# Patient Record
Sex: Male | Born: 2000
Health system: Southern US, Community
[De-identification: ages and names within clinical notes are randomized; demographics above are authoritative.]

---
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
## 2019-07-08 ENCOUNTER — Encounter (HOSPITAL_BASED_OUTPATIENT_CLINIC_OR_DEPARTMENT_OTHER): Payer: Self-pay

## 2019-07-08 ENCOUNTER — Emergency Department (HOSPITAL_BASED_OUTPATIENT_CLINIC_OR_DEPARTMENT_OTHER)
Admission: EM | Admit: 2019-07-08 | Discharge: 2019-07-08 | Disposition: A | Discharge status: Home / Self Care | Payer: Self-pay | Attending: Emergency Medicine | Admitting: Emergency Medicine

## 2019-07-08 ENCOUNTER — Other Ambulatory Visit: Payer: Self-pay

## 2019-07-08 ENCOUNTER — Emergency Department (HOSPITAL_BASED_OUTPATIENT_CLINIC_OR_DEPARTMENT_OTHER): Payer: Self-pay

## 2019-07-08 DIAGNOSIS — M5442 Lumbago with sciatica, left side: Secondary | ICD-10-CM | POA: Insufficient documentation

## 2019-07-08 DIAGNOSIS — M5441 Lumbago with sciatica, right side: Secondary | ICD-10-CM | POA: Insufficient documentation

## 2019-07-08 NOTE — ED Triage Notes (Signed)
Pt c/o lower back pain x "few months"- with bilat leg pain-denies injury-NAD-steady gait

## 2019-07-08 NOTE — ED Provider Notes (Signed)
[facility] EMERGENCY DEPARTMENT Provider Note   CSN: [id number] Arrival date & time: 07/08/19  1555     History   Chief Complaint Chief Complaint  Patient presents with  . Back Pain    HPI Aaron Alexander is a 18 y.o. male.     The history is provided by the patient and medical records. No language interpreter was used.  Back Pain  Aaron Alexander is a 18 y.o. male who presents to the Emergency Department complaining of back pain. He presents to the emergency department complaining of progressive and gradual onset low back pain. It began several months ago with no inciting injuries. Pain is located across his entire low back. It is worse with activity and turning. It is described as sharp at times. Occasionally the pain radiates to his knees bilaterally. He denies any fevers, weight changes, nausea, vomiting, abdominal pain, changes in urination, urinary incontinence or retention, numbness or weakness. He was working loading trucks but recently quit. He takes ibuprofen and Tylenol at home that gives him partial improvement in his symptoms. History reviewed. No pertinent past medical history.  There are no active problems to display for this patient.   History reviewed. No pertinent surgical history.      Home Medications    Prior to Admission medications   Medication Sig Start Date End Date Taking? Authorizing Provider  azithromycin (ZITHROMAX) 200 MG/5ML suspension Take 12 ml PO today and then 6 ml daily for the next 4 days 09/01/13   Ashley Murrain, NP  cetirizine (ZYRTEC) 10 MG tablet Take 10 mg by mouth daily.    [provider]  guaiFENesin (ROBITUSSIN) 100 MG/5ML liquid Take 5-10 mLs (100-200 mg total) by mouth every 4 (four) hours as needed for cough. 09/01/13   Ashley Murrain, NP  loratadine (CLARITIN) 10 MG tablet Take 10 mg by mouth daily.    [provider]    Family History No family history on file.  Social History Social History   Tobacco Use  . Smoking status: Never Smoker  . Smokeless tobacco: Never Used  Substance Use Topics  . Alcohol use: Yes    Comment: occ  . Drug use: No     Allergies   Amoxicillin   Review of Systems Review of Systems  Musculoskeletal: Positive for back pain.  All other systems reviewed and are negative.    Physical Exam Updated Vital Signs BP 111/71 (BP Location: Left Arm)   Pulse 62   Temp 98.4 F (36.9 C) (Oral)   Resp 18   Ht 5\' 10"  (1.778 m)   Wt 65.8 kg   SpO2 100%   BMI 20.81 kg/m   Physical Exam Vitals signs and nursing note reviewed.  Constitutional:      Appearance: He is well-developed.  HENT:     Head: Normocephalic and atraumatic.  Cardiovascular:     Rate and Rhythm: Normal rate and regular rhythm.     Heart sounds: No murmur.  Pulmonary:     Effort: Pulmonary effort is normal. No respiratory distress.     Breath sounds: Normal breath sounds.  Abdominal:     Palpations: Abdomen is soft.     Tenderness: There is no abdominal tenderness. There is no guarding or rebound.  Musculoskeletal:        General: No tenderness.     Comments: 2+ DP pulses bilateraly  Skin:    General: Skin is warm and dry.  Neurological:  Mental Status: He is alert and oriented to person, place, and time.     Comments: 5/5 strength in proximal and distal muscle groups in BLE.  Sensation to light touch intact throughout BLE.  Psychiatric:        Behavior: Behavior normal.      ED Treatments / Results  Labs (all labs ordered are listed, but only abnormal results are displayed) Labs Reviewed - No data to display  EKG None  Radiology Dg Lumbar Spine Complete  Result Date: 07/08/2019 CLINICAL DATA:  Low back pain with lower extremity radicular symptoms EXAM: LUMBAR SPINE - COMPLETE 4+ VIEW COMPARISON:  None. FINDINGS: Frontal, lateral, spot lumbosacral lateral, and bilateral oblique views were obtained. There are 5 non-rib-bearing lumbar type vertebral bodies.  The T12 ribs are hypoplastic. No fracture or spondylolisthesis. There is slight disc space narrowing at L4-5 and L5-S1. Other disc spaces appear normal. There is no appreciable facet arthropathy. IMPRESSION: Slight disc space narrowing at L4-5 and L5-S1. No appreciable facet arthropathy. No fracture or spondylolisthesis. Electronically Signed   By: Bretta Bang III M.D.   On: 07/08/2019 17:35    Procedures Procedures (including critical care time)  Medications Ordered in ED Medications - No data to display   Initial Impression / Assessment and Plan / ED Course  I have reviewed the triage vital signs and the nursing notes.  Pertinent labs & imaging results that were available during my care of the patient were reviewed by me and considered in my medical decision making (see chart for details).        Patient here for evaluation of atraumatic low back pain. He is non-toxic appearing on evaluation and neurovascularly intact. Imaging is significant for slight disc space narrowing. Discussed with patient low back pain and home care. Discussed outpatient follow-up and return precautions.  Current presentation is not consistent with epidural abscess, cauda equina.  Final Clinical Impressions(s) / ED Diagnoses   Final diagnoses:  Acute midline low back pain with bilateral sciatica    ED Discharge Orders    None       Tilden Fossa, MD 07/08/19 2350

## 2019-09-22 ENCOUNTER — Emergency Department (HOSPITAL_BASED_OUTPATIENT_CLINIC_OR_DEPARTMENT_OTHER): Payer: Self-pay

## 2019-09-22 ENCOUNTER — Other Ambulatory Visit: Payer: Self-pay

## 2019-09-22 ENCOUNTER — Encounter (HOSPITAL_BASED_OUTPATIENT_CLINIC_OR_DEPARTMENT_OTHER): Payer: Self-pay | Admitting: *Deleted

## 2019-09-22 ENCOUNTER — Emergency Department (HOSPITAL_BASED_OUTPATIENT_CLINIC_OR_DEPARTMENT_OTHER)
Admission: EM | Admit: 2019-09-22 | Discharge: 2019-09-22 | Disposition: A | Discharge status: Home / Self Care | Payer: Self-pay | Attending: Emergency Medicine | Admitting: Emergency Medicine

## 2019-09-22 DIAGNOSIS — Z20828 Contact with and (suspected) exposure to other viral communicable diseases: Secondary | ICD-10-CM | POA: Insufficient documentation

## 2019-09-22 DIAGNOSIS — F17200 Nicotine dependence, unspecified, uncomplicated: Secondary | ICD-10-CM | POA: Insufficient documentation

## 2019-09-22 DIAGNOSIS — J069 Acute upper respiratory infection, unspecified: Secondary | ICD-10-CM | POA: Insufficient documentation

## 2019-09-22 DIAGNOSIS — Z79899 Other long term (current) drug therapy: Secondary | ICD-10-CM | POA: Insufficient documentation

## 2019-09-22 MED ORDER — BENZONATATE 100 MG PO CAPS: 100.0000 mg | ORAL_CAPSULE | Freq: Three times a day (TID) | ORAL | 0 refills | Status: AC | PRN

## 2019-09-22 NOTE — Discharge Instructions (Signed)
Your Covid test is pending and should result in the next 24-48 hours. Please quarantine at home until your test has resulted.    You appear to have an upper respiratory infection (URI). An upper respiratory tract infection, or cold, is a viral infection of the air passages leading to the lungs. It is contagious and can be spread to others, especially during the first 3 or 4 days. It cannot be cured by antibiotics or other medicines. RETURN IMMEDIATELY IF you develop shortness of breath, confusion or altered mental status, a new rash, become dizzy, faint, or poorly responsive, or are unable to be cared for at home.

## 2019-09-22 NOTE — ED Provider Notes (Signed)
Aaron Alexander   CSN: [id number] Arrival date & time: 09/22/19  1301     History Chief Complaint  Patient presents with  . Cough    Aaron Alexander is a 18 y.o. male who presents emergency department with a chief complaint of cough.  Had some symptoms for 7 days.  He does occasionally smoke tobacco products.  Patient states that he has had productive cough without fever.  He denies shortness of breath.  He has pain predominantly on the right side of his chest when he coughs but denies hemoptysis, shortness of breath, unilateral leg swelling, history of blood clots including DVT or PE.  He has noticed a little bit of blood-tinged sputum from the nose.  He has not been taking anything for his symptoms.  He states that he is prone to bronchitis and pneumonias.  He denies loss of sense of taste or smell, exposure to Covid or known contacts with similar symptoms.  HPI     History reviewed. No pertinent past medical history.  There are no problems to display for this patient.   History reviewed. No pertinent surgical history.     No family history on file.  Social History   Tobacco Use  . Smoking status: Current Some Day Smoker  . Smokeless tobacco: Never Used  Substance Use Topics  . Alcohol use: Yes    Comment: occ  . Drug use: Yes    Types: Marijuana    Home Medications Prior to Admission medications   Medication Sig Start Date End Date Taking? Authorizing Provider  azithromycin (ZITHROMAX) 200 MG/5ML suspension Take 12 ml PO today and then 6 ml daily for the next 4 days 09/01/13   Ashley Murrain, NP  cetirizine (ZYRTEC) 10 MG tablet Take 10 mg by mouth daily.    [provider]  guaiFENesin (ROBITUSSIN) 100 MG/5ML liquid Take 5-10 mLs (100-200 mg total) by mouth every 4 (four) hours as needed for cough. 09/01/13   Ashley Murrain, NP  loratadine (CLARITIN) 10 MG tablet Take 10 mg by mouth daily.    [provider]      Allergies    Amoxicillin  Review of Systems   Review of Systems Ten systems reviewed and are negative for acute change, except as noted in the HPI.   Physical Exam Updated Vital Signs BP 130/76 (BP Location: Left Arm)   Pulse 74   Temp 98 F (36.7 C) (Oral)   Resp 16   Ht 5\' 9"  (1.753 m)   Wt 63 kg   SpO2 100%   BMI 20.53 kg/m   Physical Exam Vitals and nursing Alexander reviewed.  Constitutional:      General: He is not in acute distress.    Appearance: He is well-developed. He is not diaphoretic.  HENT:     Head: Normocephalic and atraumatic.  Eyes:     General: No scleral icterus.    Conjunctiva/sclera: Conjunctivae normal.  Cardiovascular:     Rate and Rhythm: Normal rate and regular rhythm.     Heart sounds: Normal heart sounds.  Pulmonary:     Effort: Pulmonary effort is normal. No respiratory distress.     Breath sounds: Normal breath sounds. No rhonchi or rales.  Abdominal:     Palpations: Abdomen is soft.     Tenderness: There is no abdominal tenderness.  Musculoskeletal:     Cervical back: Normal range of motion and neck supple.  Skin:  General: Skin is warm and dry.  Neurological:     Mental Status: He is alert.  Psychiatric:        Behavior: Behavior normal.     ED Results / Procedures / Treatments   Labs (all labs ordered are listed, but only abnormal results are displayed) Labs Reviewed - No data to display  EKG None  Radiology DG Chest 2 View  Result Date: 09/22/2019 CLINICAL DATA:  18 year old male with cough and congestion for 1 week EXAM: CHEST - 2 VIEW COMPARISON:  Prior chest x-ray 09/01/2013 FINDINGS: The lungs are clear and negative for focal airspace consolidation, pulmonary edema or suspicious pulmonary nodule. No pleural effusion or pneumothorax. Cardiac and mediastinal contours are within normal limits. No acute fracture or lytic or blastic osseous lesions. The visualized upper abdominal bowel gas pattern is unremarkable.  IMPRESSION: Normal chest x-ray. Electronically Signed   By: Malachy Moan M.D.   On: 09/22/2019 14:23    Procedures Procedures (including critical care time)  Medications Ordered in ED Medications - No data to display  ED Course  I have reviewed the triage vital signs and the nursing notes.  Pertinent labs & imaging results that were available during my care of the patient were reviewed by me and considered in my medical decision making (see chart for details).    MDM Rules/Calculators/A&P                      Pt CXR negative for acute infiltrate on my interpretation. HDS, afebrile. Will give send out covid test. Patients symptoms are consistent with URI, likely viral etiology. Discussed that antibiotics are not indicated for viral infections. Pt will be discharged with symptomatic treatment.  Verbalizes understanding and is agreeable with plan. Pt is hemodynamically stable & in NAD prior to dc.   Aaron Alexander was evaluated in Emergency Department on 09/22/2019 for the symptoms described in the history of present illness. He was evaluated in the context of the global COVID-19 pandemic, which necessitated consideration that the patient might be at risk for infection with the SARS-CoV-2 virus that causes COVID-19. Institutional protocols and algorithms that pertain to the evaluation of patients at risk for COVID-19 are in a state of rapid change based on information released by regulatory bodies including the CDC and federal and state organizations. These policies and algorithms were followed during the patient's care in the ED.   Final Clinical Impression(s) / ED Diagnoses Final diagnoses:  None    Rx / DC Orders ED Discharge Orders    None       Arthor Captain, PA-C 09/22/19 1437    Melene Plan, DO 09/22/19 1443

## 2019-09-22 NOTE — ED Triage Notes (Signed)
Pt reports cough and runny nose x 1 week, also right side rib pain

## 2019-09-24 LAB — NOVEL CORONAVIRUS, NAA (HOSP ORDER, SEND-OUT TO REF LAB; TAT 18-24 HRS): SARS-CoV-2, NAA: NOT DETECTED

## 2019-09-26 ENCOUNTER — Telehealth: Payer: Self-pay

## 2019-09-26 NOTE — Telephone Encounter (Signed)
Negative COVID results given. Patient results "NOT Detected." Caller expressed understanding. ° °

## 2019-11-06 ENCOUNTER — Encounter (HOSPITAL_BASED_OUTPATIENT_CLINIC_OR_DEPARTMENT_OTHER): Payer: Self-pay | Admitting: *Deleted

## 2019-11-06 ENCOUNTER — Emergency Department (HOSPITAL_BASED_OUTPATIENT_CLINIC_OR_DEPARTMENT_OTHER)
Admission: EM | Admit: 2019-11-06 | Discharge: 2019-11-06 | Disposition: A | Discharge status: Home / Self Care | Payer: Self-pay | Attending: Emergency Medicine | Admitting: Emergency Medicine

## 2019-11-06 ENCOUNTER — Other Ambulatory Visit: Payer: Self-pay

## 2019-11-06 DIAGNOSIS — Z20822 Contact with and (suspected) exposure to covid-19: Secondary | ICD-10-CM | POA: Insufficient documentation

## 2019-11-06 DIAGNOSIS — J029 Acute pharyngitis, unspecified: Secondary | ICD-10-CM

## 2019-11-06 DIAGNOSIS — B349 Viral infection, unspecified: Secondary | ICD-10-CM | POA: Insufficient documentation

## 2019-11-06 DIAGNOSIS — F17200 Nicotine dependence, unspecified, uncomplicated: Secondary | ICD-10-CM | POA: Insufficient documentation

## 2019-11-06 DIAGNOSIS — Z79899 Other long term (current) drug therapy: Secondary | ICD-10-CM | POA: Insufficient documentation

## 2019-11-06 DIAGNOSIS — R070 Pain in throat: Secondary | ICD-10-CM | POA: Insufficient documentation

## 2019-11-06 LAB — GROUP A STREP BY PCR: Group A Strep by PCR: NOT DETECTED

## 2019-11-06 NOTE — Discharge Instructions (Signed)
Your strep test was negative.   We have swabbed you for COVID 19 - please stay home and self isolate until you hear about your results. We will call you if positive. You will need to self isolate for 14 days if positive and are cleared: 11/21/2019. If negative you may return to work immediately.   Take OTC medications as needed for your symptoms.   Follow up with your PCP. If you do not have one you can follow up with [facility] for primary care needs.   Return to the ED for any worsening symptoms including worsening sore throat, inability to swallow liquids, drooling on yourself, difficulty breathing, abdominal pain, nausea, vomiting, or any other concerning symptoms.   If you sore throat persists for > 10 days you may need to be reevaluated for other causes.

## 2019-11-06 NOTE — ED Triage Notes (Signed)
Pt c/o sore throat , bodyaches , h/a x 3 days

## 2019-11-06 NOTE — ED Provider Notes (Signed)
[facility] EMERGENCY DEPARTMENT Provider Note   CSN: [id number] Arrival date & time: 11/06/19  1635     History Chief Complaint  Patient presents with  . Sore Throat    Aaron Alexander is a 19 y.o. male who presents to the ED today complaining of gradual onset, constant, achy, sore throat x 5-6 days.  Also complains about a left-sided headache as well as body aches in his lower back.  He is denying any recent COVID-19 positive exposure however does report that individuals at work will have tested positive and will not say anything and still go to work.  Reports that he has been taking DayQuil and NyQuil with mild relief.  He is still able to tolerate his own secretions and drink liquids without difficulty.  Denies fever, chills, cough, shortness of breath, abdominal pain, nausea, vomiting, neck stiffness, vision changes, confusion, rash, any other associated symptoms.   The history is provided by the patient.       History reviewed. No pertinent past medical history.  There are no problems to display for this patient.   History reviewed. No pertinent surgical history.     History reviewed. No pertinent family history.  Social History   Tobacco Use  . Smoking status: Current Some Day Smoker  . Smokeless tobacco: Never Used  Substance Use Topics  . Alcohol use: Yes    Comment: occ  . Drug use: Yes    Types: Marijuana    Home Medications Prior to Admission medications   Medication Sig Start Date End Date Taking? Authorizing Provider  azithromycin (ZITHROMAX) 200 MG/5ML suspension Take 12 ml PO today and then 6 ml daily for the next 4 days 09/01/13   Janne Napoleon, NP  benzonatate (TESSALON PERLES) 100 MG capsule Take 1 capsule (100 mg total) by mouth 3 (three) times daily as needed for cough (cough). 09/22/19   Arthor Captain, PA-C  cetirizine (ZYRTEC) 10 MG tablet Take 10 mg by mouth daily.    [provider]  guaiFENesin (ROBITUSSIN) 100 MG/5ML  liquid Take 5-10 mLs (100-200 mg total) by mouth every 4 (four) hours as needed for cough. 09/01/13   Janne Napoleon, NP  loratadine (CLARITIN) 10 MG tablet Take 10 mg by mouth daily.    [provider]    Allergies    Amoxicillin  Review of Systems   Review of Systems  Constitutional: Negative for chills and fever.  HENT: Positive for sore throat. Negative for drooling and voice change.   Respiratory: Negative for cough and shortness of breath.   Cardiovascular: Negative for chest pain.  Gastrointestinal: Negative for abdominal pain, nausea and vomiting.  Neurological: Positive for headaches.  All other systems reviewed and are negative.   Physical Exam Updated Vital Signs BP 113/78   Pulse 61   Temp 98.7 F (37.1 C) (Oral)   Resp 16   Ht 5\' 9"  (1.753 m)   Wt 63.5 kg   SpO2 100%   BMI 20.67 kg/m   Physical Exam Vitals and nursing note reviewed.  Constitutional:      Appearance: He is not ill-appearing or diaphoretic.     Comments: No acute distress  HENT:     Head: Normocephalic and atraumatic.     Mouth/Throat:     Mouth: Mucous membranes are moist.     Pharynx: Uvula midline. Posterior oropharyngeal erythema present.     Tonsils: No tonsillar exudate.  Eyes:     Conjunctiva/sclera: Conjunctivae normal.  Cardiovascular:     Rate and Rhythm: Normal rate and regular rhythm.     Heart sounds: Normal heart sounds.  Pulmonary:     Effort: Pulmonary effort is normal.     Breath sounds: Normal breath sounds. No wheezing, rhonchi or rales.  Abdominal:     Tenderness: There is no abdominal tenderness. There is no guarding or rebound.  Skin:    General: Skin is warm and dry.     Coloration: Skin is not jaundiced.  Neurological:     General: No focal deficit present.     Mental Status: He is alert and oriented to person, place, and time.     ED Results / Procedures / Treatments   Labs (all labs ordered are listed, but only abnormal results are  displayed) Labs Reviewed  GROUP A STREP BY PCR  NOVEL CORONAVIRUS, NAA (HOSP ORDER, SEND-OUT TO REF LAB; TAT 18-24 HRS)    EKG None  Radiology No results found.  Procedures Procedures (including critical care time)  Medications Ordered in ED Medications - No data to display  ED Course  I have reviewed the triage vital signs and the nursing notes.  Pertinent labs & imaging results that were available during my care of the patient were reviewed by me and considered in my medical decision making (see chart for details).  19 year old male with sore throat, HA, body aches x 5-6 days.  Recent COVID-19 positive exposure.  On arrival to the ED patient afebrile, nontachycardic and nontachypneic.  On my exam he is sitting up comfortably in bed.  He has some posterior oropharyngeal erythema however no exudate.  Uvula is midline.  No signs of PTA or other deep space infections.  Tolerating secretions.  Lungs clear to auscultation bilaterally.  No focal neuro deficits.  A rapid strep test was obtained prior to being seen which was negative.  Will swab for Covid at this time.  Have advised patient he will need to self isolate until he receives his results.  If positive he cannot return to work for 14 days.  Work note has been provided.  Patient advised to follow-up with his PCP.  Strict return precautions have been discussed.  Patient is in agreement with plan and stable for discharge home.   This note was prepared using Dragon voice recognition software and may include unintentional dictation errors due to the inherent limitations of voice recognition software.  Aaron Alexander was evaluated in Emergency Department on 11/06/2019 for the symptoms described in the history of present illness. He was evaluated in the context of the global COVID-19 pandemic, which necessitated consideration that the patient might be at risk for infection with the SARS-CoV-2 virus that causes COVID-19. Institutional protocols  and algorithms that pertain to the evaluation of patients at risk for COVID-19 are in a state of rapid change based on information released by regulatory bodies including the CDC and federal and state organizations. These policies and algorithms were followed during the patient's care in the ED.    MDM Rules/Calculators/A&P                       Final Clinical Impression(s) / ED Diagnoses Final diagnoses:  Sore throat  Viral illness    Rx / DC Orders ED Discharge Orders    None       Discharge Instructions     Your strep test was negative.   We have swabbed you for COVID 19 -  please stay home and self isolate until you hear about your results. We will call you if positive. You will need to self isolate for 14 days if positive and are cleared: 11/21/2019. If negative you may return to work immediately.   Take OTC medications as needed for your symptoms.   Follow up with your PCP. If you do not have one you can follow up with [facility] for primary care needs.   Return to the ED for any worsening symptoms including worsening sore throat, inability to swallow liquids, drooling on yourself, difficulty breathing, abdominal pain, nausea, vomiting, or any other concerning symptoms.   If you sore throat persists for > 10 days you may need to be reevaluated for other causes.        Tanda Rockers, PA-C 11/06/19 1757    Tegeler, Canary Brim, MD 11/07/19 Jacinta Shoe

## 2019-11-07 LAB — NOVEL CORONAVIRUS, NAA (HOSP ORDER, SEND-OUT TO REF LAB; TAT 18-24 HRS): SARS-CoV-2, NAA: NOT DETECTED

## 2020-02-20 ENCOUNTER — Encounter (HOSPITAL_BASED_OUTPATIENT_CLINIC_OR_DEPARTMENT_OTHER): Payer: Self-pay

## 2020-02-20 ENCOUNTER — Emergency Department (HOSPITAL_BASED_OUTPATIENT_CLINIC_OR_DEPARTMENT_OTHER)
Admission: EM | Admit: 2020-02-20 | Discharge: 2020-02-20 | Disposition: A | Discharge status: Home / Self Care | Payer: Self-pay | Attending: Emergency Medicine | Admitting: Emergency Medicine

## 2020-02-20 ENCOUNTER — Other Ambulatory Visit: Payer: Self-pay

## 2020-02-20 DIAGNOSIS — F17290 Nicotine dependence, other tobacco product, uncomplicated: Secondary | ICD-10-CM | POA: Insufficient documentation

## 2020-02-20 DIAGNOSIS — R1013 Epigastric pain: Secondary | ICD-10-CM | POA: Insufficient documentation

## 2020-02-20 MED ORDER — PANTOPRAZOLE SODIUM 20 MG PO TBEC: 20.0000 mg | DELAYED_RELEASE_TABLET | Freq: Every day | ORAL | 0 refills | Status: DC

## 2020-02-20 MED ORDER — PANTOPRAZOLE SODIUM 20 MG PO TBEC: 20.0000 mg | DELAYED_RELEASE_TABLET | Freq: Every day | ORAL | 0 refills | Status: AC

## 2020-02-20 MED FILL — PANTOPRAZOLE SOD DR 20 MG T: 20 | 30 days supply | Qty: 30 | Fill #0

## 2020-02-20 NOTE — ED Provider Notes (Signed)
[facility] EMERGENCY DEPARTMENT Provider Note   CSN: [id number] Arrival date & time: 02/20/20  2025     History Chief Complaint  Patient presents with  . Abdominal Pain    Aaron Alexander is a 19 y.o. male.  The history is provided by the patient.  Abdominal Pain Pain location:  Epigastric Pain quality: aching   Pain radiates to:  Does not radiate Onset quality:  Gradual Timing:  Intermittent Context: eating   Relieved by:  Nothing Worsened by:  Nothing Associated symptoms: belching and nausea   Associated symptoms: no chest pain, no chills, no cough, no diarrhea, no dysuria, no fever, no hematuria, no shortness of breath, no sore throat and no vomiting   Risk factors: has not had multiple surgeries        History reviewed. No pertinent past medical history.  There are no problems to display for this patient.   History reviewed. No pertinent surgical history.     No family history on file.  Social History   Tobacco Use  . Smoking status: Current Some Day Smoker    Types: Cigars  . Smokeless tobacco: Never Used  Substance Use Topics  . Alcohol use: Yes    Comment: occ  . Drug use: Yes    Types: Marijuana    Comment: daily    Home Medications Prior to Admission medications   Medication Sig Start Date End Date Taking? Authorizing Provider  azithromycin (ZITHROMAX) 200 MG/5ML suspension Take 12 ml PO today and then 6 ml daily for the next 4 days 09/01/13   Janne Napoleon, NP  benzonatate (TESSALON PERLES) 100 MG capsule Take 1 capsule (100 mg total) by mouth 3 (three) times daily as needed for cough (cough). 09/22/19   Arthor Captain, PA-C  cetirizine (ZYRTEC) 10 MG tablet Take 10 mg by mouth daily.    [provider]  guaiFENesin (ROBITUSSIN) 100 MG/5ML liquid Take 5-10 mLs (100-200 mg total) by mouth every 4 (four) hours as needed for cough. 09/01/13   Janne Napoleon, NP  loratadine (CLARITIN) 10 MG tablet Take 10 mg by mouth daily.     [provider]  pantoprazole (PROTONIX) 20 MG tablet Take 1 tablet (20 mg total) by mouth daily. 02/20/20 03/21/20  Virgina Norfolk, DO    Allergies    Amoxicillin  Review of Systems   Review of Systems  Constitutional: Negative for chills and fever.  HENT: Negative for ear pain and sore throat.   Eyes: Negative for pain and visual disturbance.  Respiratory: Negative for cough and shortness of breath.   Cardiovascular: Negative for chest pain and palpitations.  Gastrointestinal: Positive for abdominal pain and nausea. Negative for diarrhea and vomiting.  Genitourinary: Negative for dysuria and hematuria.  Musculoskeletal: Negative for arthralgias and back pain.  Skin: Negative for color change and rash.  Neurological: Negative for seizures and syncope.  All other systems reviewed and are negative.   Physical Exam Updated Vital Signs BP 121/67 (BP Location: Right Arm)   Pulse 60   Temp 99 F (37.2 C) (Oral)   Resp 18   Ht 5\' 9"  (1.753 m)   Wt 65.8 kg   SpO2 100%   BMI 21.41 kg/m   Physical Exam Vitals and nursing note reviewed.  Constitutional:      General: He is not in acute distress.    Appearance: He is well-developed. He is not ill-appearing.  HENT:     Head: Normocephalic and atraumatic.  Mouth/Throat:     Mouth: Mucous membranes are moist.  Eyes:     Extraocular Movements: Extraocular movements intact.     Conjunctiva/sclera: Conjunctivae normal.     Pupils: Pupils are equal, round, and reactive to light.  Cardiovascular:     Rate and Rhythm: Normal rate and regular rhythm.     Heart sounds: Normal heart sounds. No murmur.  Pulmonary:     Effort: Pulmonary effort is normal. No respiratory distress.     Breath sounds: Normal breath sounds.  Abdominal:     General: There is no distension.     Palpations: Abdomen is soft.     Tenderness: There is no abdominal tenderness. There is no right CVA tenderness, left CVA tenderness, guarding or rebound.  Negative signs include Murphy's sign and Rovsing's sign.  Musculoskeletal:     Cervical back: Neck supple.  Skin:    General: Skin is warm and dry.     Capillary Refill: Capillary refill takes less than 2 seconds.  Neurological:     Mental Status: He is alert.     ED Results / Procedures / Treatments   Labs (all labs ordered are listed, but only abnormal results are displayed) Labs Reviewed - No data to display  EKG None  Radiology No results found.  Procedures Procedures (including critical care time)  Medications Ordered in ED Medications - No data to display  ED Course  I have reviewed the triage vital signs and the nursing notes.  Pertinent labs & imaging results that were available during my care of the patient were reviewed by me and considered in my medical decision making (see chart for details).    MDM Rules/Calculators/A&P                      [name] is a 19 year old male with no significant medical history presents to the ED with abdominal pain.  Patient with unremarkable vitals.  No fever.  Pain mostly in the epigastric region for several months on and off.  Appears to get worse with eating, acid reflux burning.  Especially at night when he is sleeping he feels a sour taste, up his throat.  Seems to have more symptoms when eating certain foods.  Does not have any focal tenderness on abdominal exam.  No concern for pancreatitis, cholecystitis, appendicitis or bowel obstruction.  Overall symptoms appear consistent with reflux.  Educated about foods to avoid.  Recommend avoiding alcohol as well.  Will start Protonix and have him follow-up outpatient.  Given return precautions and discharged from ED in good condition.  This chart was dictated using voice recognition software.  Despite best efforts to proofread,  errors can occur which can change the documentation meaning.    Final Clinical Impression(s) / ED Diagnoses Final diagnoses:  Epigastric pain     Rx / DC Orders ED Discharge Orders         Ordered    pantoprazole (PROTONIX) 20 MG tablet  Daily,   Status:  Discontinued     02/20/20 0809    pantoprazole (PROTONIX) 20 MG tablet  Daily     02/20/20 0809           Lennice Sites, DO 02/20/20 [phone]

## 2020-02-20 NOTE — ED Triage Notes (Signed)
Pt arrives with c/o abdominal pain "for months" with vomiting. Pt reports he tends to vomit more when eating out or when eating large meals. NAD.

## 2020-04-12 ENCOUNTER — Encounter (HOSPITAL_BASED_OUTPATIENT_CLINIC_OR_DEPARTMENT_OTHER): Payer: Self-pay | Admitting: Emergency Medicine

## 2020-04-12 ENCOUNTER — Other Ambulatory Visit: Payer: Self-pay

## 2020-04-12 DIAGNOSIS — M79674 Pain in right toe(s): Secondary | ICD-10-CM | POA: Insufficient documentation

## 2020-04-12 DIAGNOSIS — Z5321 Procedure and treatment not carried out due to patient leaving prior to being seen by health care provider: Secondary | ICD-10-CM | POA: Insufficient documentation

## 2020-04-12 NOTE — ED Triage Notes (Signed)
Right great toe pain since yesterday. Small area of swelling about the size of a pea below nail.

## 2020-04-13 ENCOUNTER — Emergency Department (HOSPITAL_BASED_OUTPATIENT_CLINIC_OR_DEPARTMENT_OTHER)
Admission: EM | Admit: 2020-04-13 | Discharge: 2020-04-13 | Disposition: A | Discharge status: Home / Self Care | Payer: Self-pay | Attending: Emergency Medicine | Admitting: Emergency Medicine

## 2020-04-13 NOTE — ED Notes (Signed)
No answer in lobby x 2.

## 2020-04-13 NOTE — ED Notes (Addendum)
Called x1 for treatment room at 0122, no answer

## 2020-05-26 ENCOUNTER — Encounter (HOSPITAL_BASED_OUTPATIENT_CLINIC_OR_DEPARTMENT_OTHER): Payer: Self-pay

## 2020-05-26 ENCOUNTER — Other Ambulatory Visit: Payer: Self-pay

## 2020-05-26 ENCOUNTER — Emergency Department (HOSPITAL_BASED_OUTPATIENT_CLINIC_OR_DEPARTMENT_OTHER)
Admission: EM | Admit: 2020-05-26 | Discharge: 2020-05-26 | Disposition: A | Discharge status: Home / Self Care | Payer: Self-pay | Attending: Emergency Medicine | Admitting: Emergency Medicine

## 2020-05-26 DIAGNOSIS — J069 Acute upper respiratory infection, unspecified: Secondary | ICD-10-CM | POA: Insufficient documentation

## 2020-05-26 DIAGNOSIS — Z5321 Procedure and treatment not carried out due to patient leaving prior to being seen by health care provider: Secondary | ICD-10-CM | POA: Insufficient documentation

## 2020-05-26 NOTE — ED Triage Notes (Signed)
Pt c/o URI sx with loss of taste and smell x 2 days-NAD-steady gait

## 2021-07-13 ENCOUNTER — Encounter (HOSPITAL_BASED_OUTPATIENT_CLINIC_OR_DEPARTMENT_OTHER): Payer: Self-pay

## 2021-07-13 ENCOUNTER — Emergency Department (HOSPITAL_BASED_OUTPATIENT_CLINIC_OR_DEPARTMENT_OTHER)
Admission: EM | Admit: 2021-07-13 | Discharge: 2021-07-13 | Disposition: A | Discharge status: Home / Self Care | Payer: Self-pay | Attending: Emergency Medicine | Admitting: Emergency Medicine

## 2021-07-13 ENCOUNTER — Other Ambulatory Visit: Payer: Self-pay

## 2021-07-13 ENCOUNTER — Emergency Department (HOSPITAL_BASED_OUTPATIENT_CLINIC_OR_DEPARTMENT_OTHER): Payer: Self-pay

## 2021-07-13 DIAGNOSIS — M62830 Muscle spasm of back: Secondary | ICD-10-CM

## 2021-07-13 DIAGNOSIS — M62838 Other muscle spasm: Secondary | ICD-10-CM | POA: Insufficient documentation

## 2021-07-13 DIAGNOSIS — Z87891 Personal history of nicotine dependence: Secondary | ICD-10-CM | POA: Insufficient documentation

## 2021-07-13 DIAGNOSIS — M546 Pain in thoracic spine: Secondary | ICD-10-CM | POA: Insufficient documentation

## 2021-07-13 MED ORDER — MELOXICAM 7.5 MG PO TABS: 7.5000 mg | ORAL_TABLET | Freq: Every day | ORAL | 0 refills | Status: AC

## 2021-07-13 MED ORDER — LIDOCAINE 5 % EX PTCH: 1.0000 | MEDICATED_PATCH | CUTANEOUS | 0 refills | Status: AC

## 2021-07-13 MED ORDER — METHOCARBAMOL 500 MG PO TABS: 500.0000 mg | ORAL_TABLET | Freq: Two times a day (BID) | ORAL | 0 refills | Status: DC

## 2021-07-13 NOTE — Discharge Instructions (Signed)
Apply Lidoderm patch as needed as directed.  Take meloxicam and Robaxin as needed as prescribed.  Recommend warm compresses for 20 minutes at a time followed by gentle stretching.  Follow-up with sports medicine if not improving after 7 to 10 days.

## 2021-07-13 NOTE — ED Triage Notes (Signed)
Patient complains of "lung pain" Patient states his upper back is hurting. Denies cervical pain/tenderness. States it is not spinal pain.  States he feels like he cant take a full breath. Respirations even/unlabored. Speaking in full sentences

## 2021-07-13 NOTE — ED Provider Notes (Signed)
[facility] EMERGENCY DEPARTMENT Provider Note   CSN: [id number] Arrival date & time: 07/13/21  1157     History Chief Complaint  Patient presents with   Back Pain    Aaron Alexander is a 20 y.o. male.  20 year old male with complaint of upper back pain onset last night, progressively worsening. Reports mild SHOB with this. 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 Marijuana use, does not smoke. Has not taken anything for his pain. Did try stretching which made his pain worse.      History reviewed. No pertinent past medical history.  There are no problems to display for this patient.   History reviewed. No pertinent surgical history.     History reviewed. No pertinent family history.  Social History   Tobacco Use   Smoking status: Former    Types: Cigars    Quit date: 06/13/2021    Years since quitting: 0.0   Smokeless tobacco: Never  Vaping Use   Vaping Use: Former  Substance Use Topics   Alcohol use: Yes    Comment: occ   Drug use: Not Currently    Types: Marijuana    Comment: occ    Home Medications Prior to Admission medications   Medication Sig Start Date End Date Taking? Authorizing Provider  lidocaine (LIDODERM) 5 % Place 1 patch onto the skin daily. Remove & Discard patch within 12 hours or as directed by MD 07/13/21  Yes Jeannie Fend, PA-C  meloxicam (MOBIC) 7.5 MG tablet Take 1 tablet (7.5 mg total) by mouth daily for 10 days. 07/13/21 07/23/21 Yes Jeannie Fend, PA-C  methocarbamol (ROBAXIN) 500 MG tablet Take 1 tablet (500 mg total) by mouth 2 (two) times daily. 07/13/21  Yes Jeannie Fend, PA-C  azithromycin [facility]) 200 MG/5ML suspension Take 12 ml PO today and then 6 ml daily for the next 4 days 09/01/13   Janne Napoleon, NP  benzonatate (TESSALON PERLES) 100 MG capsule Take 1 capsule (100 mg total) by mouth 3 (three) times daily as needed for cough (cough). 09/22/19   Arthor Captain, PA-C  cetirizine (ZYRTEC) 10 MG tablet Take 10 mg by mouth daily.    [provider]  guaiFENesin (ROBITUSSIN) 100 MG/5ML liquid Take 5-10 mLs (100-200 mg total) by mouth every 4 (four) hours as needed for cough. 09/01/13   Janne Napoleon, NP  loratadine (CLARITIN) 10 MG tablet Take 10 mg by mouth daily.    [provider]  pantoprazole (PROTONIX) 20 MG tablet Take 1 tablet (20 mg total) by mouth daily. 02/20/20 03/21/20  Virgina Norfolk, DO    Allergies    Amoxicillin  Review of Systems   Review of Systems  Constitutional:  Negative for fever.  Respiratory:  Positive for shortness of breath.   Cardiovascular:  Negative for chest pain.  Gastrointestinal:  Negative for abdominal pain.  Musculoskeletal:  Positive for back pain. Negative for gait problem, neck pain and neck stiffness.  Skin:  Negative for rash and wound.  Allergic/Immunologic: Negative for immunocompromised state.  Neurological:  Negative for weakness and numbness.  Hematological:  Negative for adenopathy.  All other systems reviewed and are negative.  Physical Exam Updated Vital Signs BP 116/62 (BP Location: Left Arm)   Pulse 72   Temp 98 F (36.7 C) (Oral)   Resp 16   Ht 5\' 9"  (1.753 m)   Wt 65.8 kg   SpO2 100%  BMI 21.41 kg/m   Physical Exam Vitals and nursing note reviewed.  Constitutional:      General: He is not in acute distress.    Appearance: He is well-developed. He is not diaphoretic.  HENT:     Head: Normocephalic and atraumatic.  Cardiovascular:     Rate and Rhythm: Normal rate and regular rhythm.     Heart sounds: Normal heart sounds.  Pulmonary:     Effort: Pulmonary effort is normal.     Breath sounds: Normal breath sounds.  Musculoskeletal:        General: Tenderness present. No swelling.     Cervical back: Normal. No tenderness or bony tenderness.     Thoracic back: Tenderness present. No bony tenderness.     Lumbar back: No tenderness or bony tenderness.        Back:     Comments: Worse with flexion/extension  Skin:    General: Skin is warm and dry.     Findings: No erythema or rash.  Neurological:     Mental Status: He is alert and oriented to person, place, and time.     Motor: No weakness.     Gait: Gait normal.  Psychiatric:        Behavior: Behavior normal.    ED Results / Procedures / Treatments   Labs (all labs ordered are listed, but only abnormal results are displayed) Labs Reviewed - No data to display  EKG None  Radiology DG Chest Portable 1 View  Result Date: 07/13/2021 CLINICAL DATA:  Chest pain EXAM: PORTABLE CHEST 1 VIEW COMPARISON:  09/22/2019 FINDINGS: The heart size and mediastinal contours are within normal limits. Both lungs are clear. The visualized skeletal structures are unremarkable. IMPRESSION: Normal chest x-ray. Electronically Signed   By: Duanne Guess D.O.   On: 07/13/2021 12:42    Procedures Procedures   Medications Ordered in ED Medications - No data to display  ED Course  I have reviewed the triage vital signs and the nursing notes.  Pertinent labs & imaging results that were available during my care of the patient were reviewed by me and considered in my medical decision making (see chart for details).  Clinical Course as of 07/13/21 1543  Tue Jul 13, 2021  [id number] 20 year old male with complaint of pain in his upper back as above.  He is tender to palpation along the left and right trapezius extending along medial border of bilateral scapula.  No midline or bony tenderness.  No history of trauma.  Chest x-ray is unremarkable.  Suspect muscle spasm, along long thoracic nerve distribution.  Recommend Lidoderm patches, warm compresses and gentle stretching.  We will also give meloxicam and Robaxin. Recommend follow-up with sports medicine if not improving after 7 to 10 days. [LM]    Clinical Course User Index [LM] Alden Hipp   MDM Rules/Calculators/A&P                            Final Clinical Impression(s) / ED Diagnoses Final diagnoses:  Muscle spasm of back    Rx / DC Orders ED Discharge Orders          Ordered    methocarbamol (ROBAXIN) 500 MG tablet  2 times daily        07/13/21 1539    meloxicam (MOBIC) 7.5 MG tablet  Daily        07/13/21 1539    lidocaine (LIDODERM) 5 %  Every 24 hours        07/13/21 1539             Alden Hipp 07/13/21 1543    Tegeler, Canary Brim, MD 07/13/21 [phone]

## 2021-10-11 ENCOUNTER — Emergency Department (HOSPITAL_BASED_OUTPATIENT_CLINIC_OR_DEPARTMENT_OTHER)
Admission: EM | Admit: 2021-10-11 | Discharge: 2021-10-11 | Disposition: A | Discharge status: Home / Self Care | Payer: Commercial Managed Care - HMO | Attending: Emergency Medicine | Admitting: Emergency Medicine

## 2021-10-11 ENCOUNTER — Other Ambulatory Visit: Payer: Self-pay

## 2021-10-11 ENCOUNTER — Emergency Department (HOSPITAL_BASED_OUTPATIENT_CLINIC_OR_DEPARTMENT_OTHER): Payer: Commercial Managed Care - HMO

## 2021-10-11 ENCOUNTER — Encounter (HOSPITAL_BASED_OUTPATIENT_CLINIC_OR_DEPARTMENT_OTHER): Payer: Self-pay

## 2021-10-11 DIAGNOSIS — Y9366 Activity, soccer: Secondary | ICD-10-CM | POA: Diagnosis not present

## 2021-10-11 DIAGNOSIS — S99911A Unspecified injury of right ankle, initial encounter: Secondary | ICD-10-CM | POA: Diagnosis present

## 2021-10-11 DIAGNOSIS — S93401A Sprain of unspecified ligament of right ankle, initial encounter: Secondary | ICD-10-CM

## 2021-10-11 DIAGNOSIS — S93439A Sprain of tibiofibular ligament of unspecified ankle, initial encounter: Secondary | ICD-10-CM | POA: Diagnosis not present

## 2021-10-11 DIAGNOSIS — W2102XA Struck by soccer ball, initial encounter: Secondary | ICD-10-CM | POA: Diagnosis not present

## 2021-10-11 MED ORDER — METHOCARBAMOL 500 MG PO TABS: 500.0000 mg | ORAL_TABLET | Freq: Two times a day (BID) | ORAL | 0 refills | Status: AC

## 2021-10-11 NOTE — ED Provider Notes (Signed)
[facility] EMERGENCY DEPARTMENT Provider Note   CSN: [id number] Arrival date & time: 10/11/21  1340     History  Chief Complaint  Patient presents with   Leg Injury    Aaron Alexander is a 21 y.o. male who presents after an injury while playing soccer yesterday.  Patient reports that he slid, fell onto his right lower extremity, collapsed onto his right ankle with his buttock.  Patient reports that he is having difficulty walking after the injury.  Patient denies any numbness, tingling.  Patient has not taken anything for pain at this time.  Patient is applying ice to the affected area.  HPI     Home Medications Prior to Admission medications   Medication Sig Start Date End Date Taking? Authorizing Provider  methocarbamol (ROBAXIN) 500 MG tablet Take 1 tablet (500 mg total) by mouth 2 (two) times daily. 10/11/21  Yes Ryelan Kazee H, PA-C  azithromycin (ZITHROMAX) 200 MG/5ML suspension Take 12 ml PO today and then 6 ml daily for the next 4 days 09/01/13   Ashley Murrain, NP  benzonatate (TESSALON PERLES) 100 MG capsule Take 1 capsule (100 mg total) by mouth 3 (three) times daily as needed for cough (cough). 09/22/19   Margarita Mail, PA-C  cetirizine (ZYRTEC) 10 MG tablet Take 10 mg by mouth daily.    [provider]  guaiFENesin (ROBITUSSIN) 100 MG/5ML liquid Take 5-10 mLs (100-200 mg total) by mouth every 4 (four) hours as needed for cough. 09/01/13   Ashley Murrain, NP  lidocaine (LIDODERM) 5 % Place 1 patch onto the skin daily. Remove & Discard patch within 12 hours or as directed by MD 07/13/21   Tacy Learn, PA-C  loratadine (CLARITIN) 10 MG tablet Take 10 mg by mouth daily.    [provider]  pantoprazole (PROTONIX) 20 MG tablet Take 1 tablet (20 mg total) by mouth daily. 02/20/20 03/21/20  Lennice Sites, DO      Allergies    Amoxicillin    Review of Systems   Review of Systems  Musculoskeletal:  Positive for joint swelling.  All other  systems reviewed and are negative.  Physical Exam Updated Vital Signs BP 130/73 (BP Location: Right Arm)    Pulse 88    Temp 98.2 F (36.8 C) (Oral)    Resp 18    Ht 5\' 9"  (1.753 m)    Wt 71.7 kg    SpO2 100%    BMI 23.33 kg/m  Physical Exam Vitals and nursing note reviewed.  Constitutional:      General: He is not in acute distress.    Appearance: Normal appearance.  HENT:     Head: Normocephalic and atraumatic.  Eyes:     General:        Right eye: No discharge.        Left eye: No discharge.  Cardiovascular:     Rate and Rhythm: Normal rate and regular rhythm.     Pulses: Normal pulses.     Comments: DP, PT pulses intact bilaterally Pulmonary:     Effort: Pulmonary effort is normal. No respiratory distress.  Musculoskeletal:        General: No deformity.     Comments: Significant tenderness to palpation of the lateral malleolus, lateral aspect of right foot, some tenderness to compression of the tibia, fibula.  Skin:    General: Skin is warm and dry.     Capillary Refill: Capillary refill takes less than 2 seconds.  Neurological:     Mental Status: He is alert and oriented to person, place, and time.  Psychiatric:        Mood and Affect: Mood normal.        Behavior: Behavior normal.    ED Results / Procedures / Treatments   Labs (all labs ordered are listed, but only abnormal results are displayed) Labs Reviewed - No data to display  EKG None  Radiology DG Tibia/Fibula Right  Result Date: 10/11/2021 CLINICAL DATA:  Soccer injury yesterday, now with right lower extremity pain. EXAM: RIGHT TIBIA AND FIBULA - 2 VIEW COMPARISON:  Right ankle radiographs-earlier same day FINDINGS: Suspected minimal amount of soft tissue swelling about the lateral malleolus. No associated fracture or dislocation. No radiopaque foreign body. IMPRESSION: Soft tissue swelling about the lateral malleolus without associated fracture or radiopaque foreign body. Electronically Signed   By: Sandi Mariscal M.D.   On: 10/11/2021 14:24   DG Ankle Complete Right  Result Date: 10/11/2021 CLINICAL DATA:  Injured right lower extremity pain soccer yesterday. EXAM: RIGHT ANKLE - COMPLETE 3+ VIEW COMPARISON:  Right tibia and fibular radiographs-earlier same day FINDINGS: There is a minimal amount of soft tissue swelling about the lateral malleolus. No associated fracture or dislocation. Joint spaces appear preserved. The ankle mortise appears preserved given obliquity. No definite ankle joint effusion. No plantar calcaneal spur. Note is made of a small os trigonum. No radiopaque foreign body. IMPRESSION: Minimal amount of soft tissue swelling about the lateral malleolus without associated fracture or dislocation. Electronically Signed   By: Sandi Mariscal M.D.   On: 10/11/2021 14:17    Procedures Procedures    Medications Ordered in ED Medications - No data to display  ED Course/ Medical Decision Making/ A&P                           Medical Decision Making  Is an overall well-appearing patient who presents with concern for an acute injury to the right lower extremity after playing soccer yesterday.  The differential diagnosis includes ankle sprain, ankle fracture, tib-fib fracture versus other.  This is not an exhaustive differential.  Physical exam is remarkable for some tenderness to palpation of the tibia/fibula to compression.  Otherwise tenderness palpation of the lateral malleolus, ATFL distribution, lateral aspect of foot.  I personally ordered and reviewed radiographic imaging of the ankle, tib-fib show significant for soft swelling around the lateral malleolus without any evidence of fracture.  Venous pain discussed that it is possible that the very small fracture was missed on radiographic imaging, encouraged to follow-up with orthopedics if he has lingering pain in 1 to 2 weeks.  Encouraged RICE, scheduled ibuprofen Tylenol, will provide prescription for muscle relaxant.  Will discharge in  lace up ankle brace and crutches.  Patient discharged in stable condition, return precautions given. Final Clinical Impression(s) / ED Diagnoses Final diagnoses:  Sprain of right ankle, unspecified ligament, initial encounter    Rx / DC Orders ED Discharge Orders          Ordered    methocarbamol (ROBAXIN) 500 MG tablet  2 times daily        10/11/21 1543              Chanise Habeck, Joesph Fillers, PA-C 10/11/21 [place], [place], DO 10/11/21 1552

## 2021-10-11 NOTE — Discharge Instructions (Addendum)
Please use Tylenol or ibuprofen for pain.  You may use 600 mg ibuprofen every 6 hours or 1000 mg of Tylenol every 6 hours.  You may choose to alternate between the 2.  This would be most effective.  Not to exceed 4 g of Tylenol within 24 hours.  Not to exceed 3200 mg ibuprofen 24 hours.  You can use the robaxin up to twice daily in addition to the above. I encourage rest, ice, compression, and elevation of the affected extremity. You can bear weight as tolerated over the next few days. I recommend following up with orthopedics if pain is not resolved in one to two weeks.

## 2021-10-11 NOTE — ED Triage Notes (Signed)
Pt states he injured right LE yesterday playing soccer-pain to ankle and lower tib/fib-NAD- to triage in w/c

## 2021-10-13 ENCOUNTER — Ambulatory Visit: Payer: Managed Care, Other (non HMO) | Admitting: Family Medicine

## 2022-05-23 IMAGING — DX DG ANKLE COMPLETE 3+V*R*
3 series · 3 of 3 positions shown · non-contrast
Comparison: Right tibia and fibular radiographs-earlier same day

CLINICAL DATA: Injured right lower extremity pain soccer yesterday.

EXAM:
RIGHT ANKLE - COMPLETE 3+ VIEW

[ankle ap]
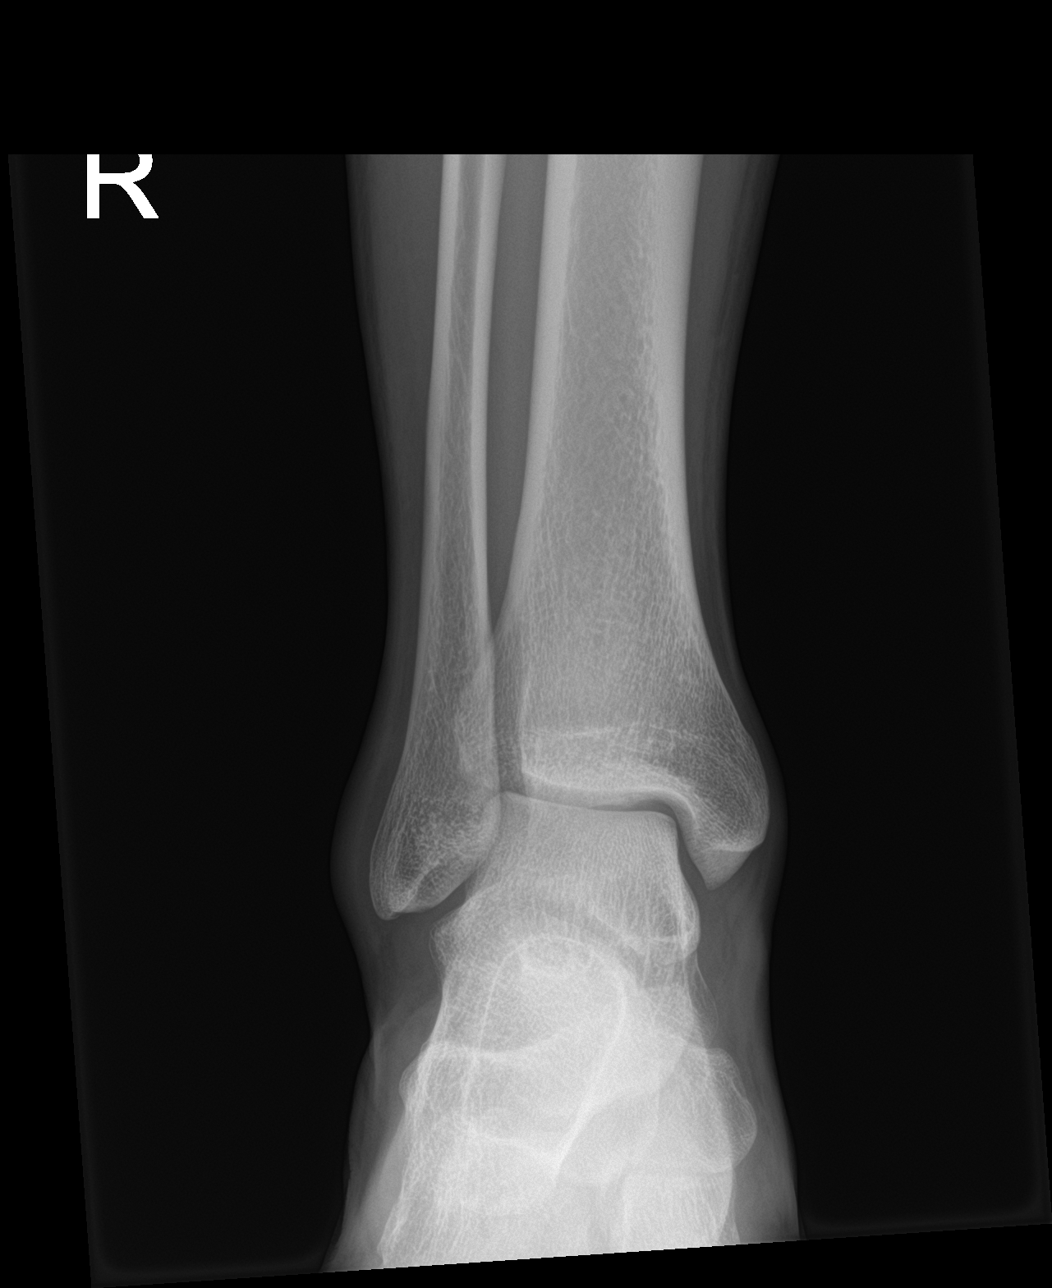

[ankle obl]
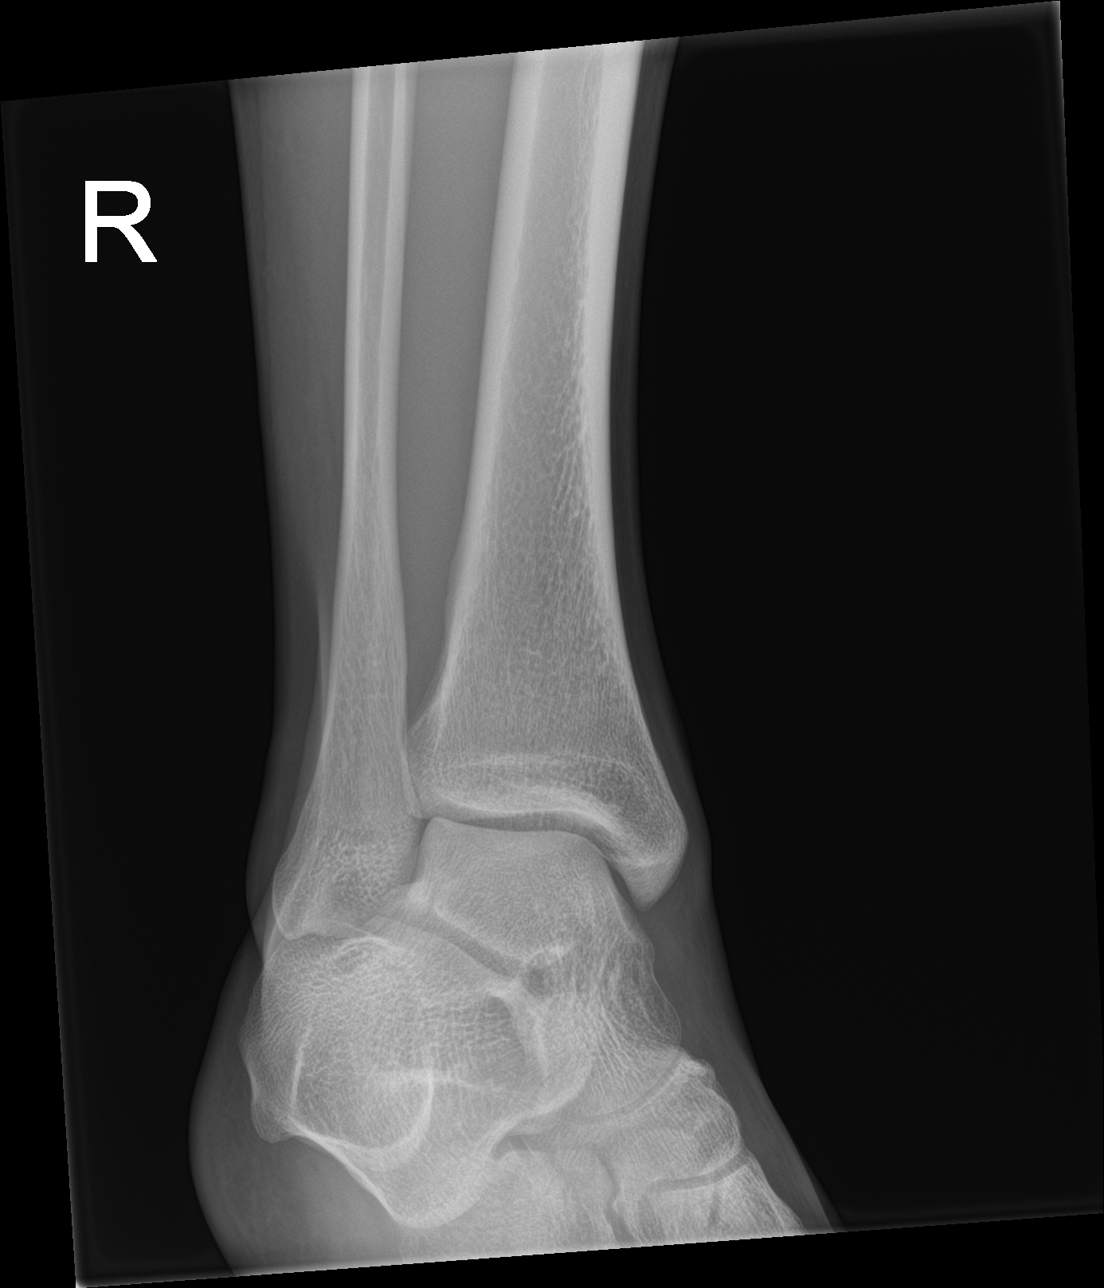

[ankle lat]
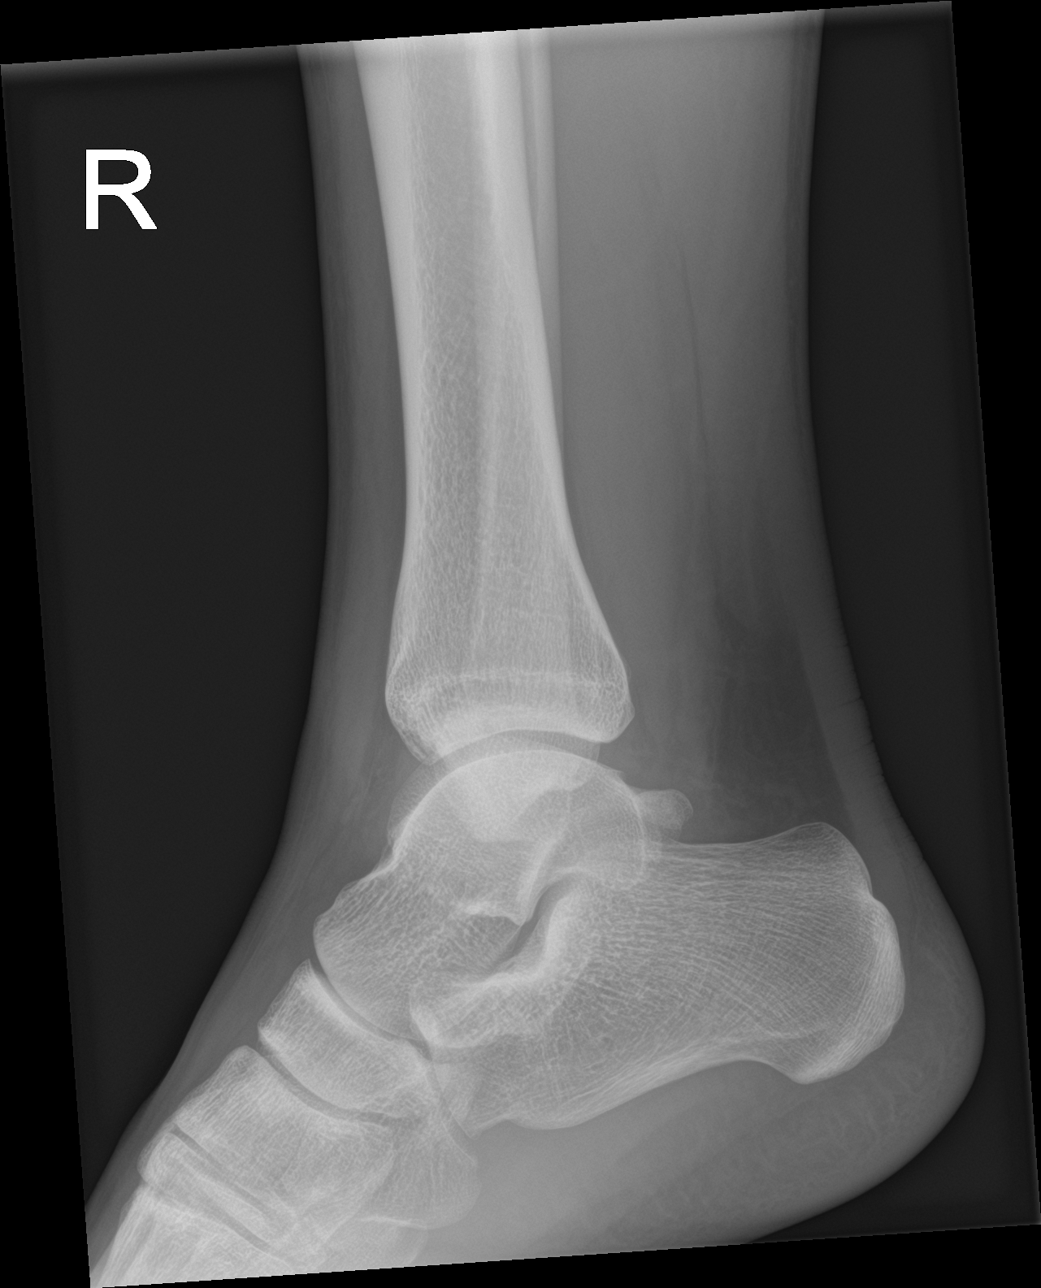

[3 of 3 positions shown; findings below may reference images not displayed]

FINDINGS: There is a minimal amount of soft tissue swelling about the lateral
malleolus. No associated fracture or dislocation. Joint spaces
appear preserved. The ankle mortise appears preserved given
obliquity. No definite ankle joint effusion. No plantar calcaneal
spur. Note is made of a small os trigonum. No radiopaque foreign
body.
IMPRESSION: Minimal amount of soft tissue swelling about the lateral malleolus
without associated fracture or dislocation.

## 2023-01-11 ENCOUNTER — Encounter: Payer: Self-pay | Admitting: *Deleted
